# Patient Record
Sex: Male | Born: 1960 | Race: White | Hispanic: No | Marital: Married | Smoking: Never smoker
Health system: Southern US, Community
[De-identification: ages and names within clinical notes are randomized; demographics above are authoritative.]

## PROBLEM LIST (undated history)

## (undated) DIAGNOSIS — E119 Type 2 diabetes mellitus without complications: Secondary | ICD-10-CM

## (undated) DIAGNOSIS — N289 Disorder of kidney and ureter, unspecified: Secondary | ICD-10-CM

## (undated) DIAGNOSIS — C801 Malignant (primary) neoplasm, unspecified: Secondary | ICD-10-CM

## (undated) DIAGNOSIS — E079 Disorder of thyroid, unspecified: Secondary | ICD-10-CM

## (undated) DIAGNOSIS — K219 Gastro-esophageal reflux disease without esophagitis: Secondary | ICD-10-CM

## (undated) HISTORY — PX: THYROIDECTOMY: SHX17

---

## 2014-06-19 ENCOUNTER — Emergency Department (HOSPITAL_BASED_OUTPATIENT_CLINIC_OR_DEPARTMENT_OTHER)
Admission: EM | Admit: 2014-06-19 | Discharge: 2014-06-19 | Disposition: A | Discharge status: Home / Self Care | Payer: BC Managed Care – PPO | Attending: Emergency Medicine | Admitting: Emergency Medicine

## 2014-06-19 ENCOUNTER — Encounter (HOSPITAL_BASED_OUTPATIENT_CLINIC_OR_DEPARTMENT_OTHER): Payer: Self-pay | Admitting: Emergency Medicine

## 2014-06-19 DIAGNOSIS — R0789 Other chest pain: Secondary | ICD-10-CM | POA: Diagnosis present

## 2014-06-19 DIAGNOSIS — E079 Disorder of thyroid, unspecified: Secondary | ICD-10-CM | POA: Insufficient documentation

## 2014-06-19 DIAGNOSIS — Z79899 Other long term (current) drug therapy: Secondary | ICD-10-CM | POA: Insufficient documentation

## 2014-06-19 DIAGNOSIS — Z859 Personal history of malignant neoplasm, unspecified: Secondary | ICD-10-CM | POA: Insufficient documentation

## 2014-06-19 DIAGNOSIS — K219 Gastro-esophageal reflux disease without esophagitis: Secondary | ICD-10-CM | POA: Insufficient documentation

## 2014-06-19 DIAGNOSIS — E119 Type 2 diabetes mellitus without complications: Secondary | ICD-10-CM | POA: Insufficient documentation

## 2014-06-19 HISTORY — DX: Type 2 diabetes mellitus without complications: E11.9

## 2014-06-19 HISTORY — DX: Gastro-esophageal reflux disease without esophagitis: K21.9

## 2014-06-19 HISTORY — DX: Disorder of thyroid, unspecified: E07.9

## 2014-06-19 HISTORY — DX: Malignant (primary) neoplasm, unspecified: C80.1

## 2014-06-19 LAB — COMPREHENSIVE METABOLIC PANEL
ALBUMIN: 4 g/dL (ref 3.5–5.2)
ALT: 24 U/L (ref 0–53)
ANION GAP: 14 (ref 5–15)
AST: 20 U/L (ref 0–37)
Alkaline Phosphatase: 44 U/L (ref 39–117)
BUN: 20 mg/dL (ref 6–23)
CALCIUM: 9.9 mg/dL (ref 8.4–10.5)
CO2: 25 mEq/L (ref 19–32)
Chloride: 101 mEq/L (ref 96–112)
Creatinine, Ser: 1 mg/dL (ref 0.50–1.35)
GFR calc non Af Amer: 85 mL/min — ABNORMAL LOW (ref >90)
Glucose, Bld: 116 mg/dL — ABNORMAL HIGH (ref 70–99)
Potassium: 3.8 mEq/L (ref 3.7–5.3)
Sodium: 140 mEq/L (ref 137–147)
TOTAL PROTEIN: 7.5 g/dL (ref 6.0–8.3)
Total Bilirubin: 0.4 mg/dL (ref 0.3–1.2)

## 2014-06-19 LAB — CBC
HCT: 39.5 % (ref 39.0–52.0)
Hemoglobin: 13.8 g/dL (ref 13.0–17.0)
MCH: 31 pg (ref 26.0–34.0)
MCHC: 34.9 g/dL (ref 30.0–36.0)
MCV: 88.8 fL (ref 78.0–100.0)
Platelets: 225 10*3/uL (ref 150–400)
RBC: 4.45 MIL/uL (ref 4.22–5.81)
RDW: 12.9 % (ref 11.5–15.5)
WBC: 11.4 10*3/uL — AB (ref 4.0–10.5)

## 2014-06-19 LAB — TROPONIN I: Troponin I: <0.3 ng/mL (ref <0.30)

## 2014-06-19 MED ORDER — SUCRALFATE 1 G PO TABS: 1.0000 g | ORAL_TABLET | Freq: Four times a day (QID) | ORAL | Status: AC

## 2014-06-19 MED ORDER — GI COCKTAIL ~~LOC~~
30.0000 mL | Freq: Once | ORAL | Status: AC
  Administered 2014-06-19: 30 mL via ORAL
  Filled 2014-06-19: qty 30

## 2014-06-19 MED ORDER — PANTOPRAZOLE SODIUM 20 MG PO TBEC: 40.0000 mg | DELAYED_RELEASE_TABLET | Freq: Every day | ORAL | Status: AC

## 2014-06-19 NOTE — ED Notes (Signed)
Reports acid reflux x 2 weeks   Getting worse

## 2014-06-19 NOTE — Discharge Instructions (Signed)
Food Choices for Gastroesophageal Reflux Disease When you have gastroesophageal reflux disease (GERD), the foods you eat and your eating habits are very important. Choosing the right foods can help ease the discomfort of GERD. WHAT GENERAL GUIDELINES DO I NEED TO FOLLOW?  Choose fruits, vegetables, whole grains, low-fat dairy products, and low-fat meat, fish, and poultry.  Limit fats such as oils, salad dressings, butter, nuts, and avocado.  Keep a food diary to identify foods that cause symptoms.  Avoid foods that cause reflux. These may be different for different people.  Eat frequent small meals instead of three large meals each day.  Eat your meals slowly, in a relaxed setting.  Limit fried foods.  Cook foods using methods other than frying.  Avoid drinking alcohol.  Avoid drinking large amounts of liquids with your meals.  Avoid bending over or lying down until 2-3 hours after eating. WHAT FOODS ARE NOT RECOMMENDED? The following are some foods and drinks that may worsen your symptoms: Vegetables Tomatoes. Tomato juice. Tomato and spaghetti sauce. Chili peppers. Onion and garlic. Horseradish. Fruits Oranges, grapefruit, and lemon (fruit and juice). Meats High-fat meats, fish, and poultry. This includes hot dogs, ribs, ham, sausage, salami, and bacon. Dairy Whole milk and chocolate milk. Sour cream. Cream. Butter. Ice cream. Cream cheese.  Beverages Coffee and tea, with or without caffeine. Carbonated beverages or energy drinks. Condiments Hot sauce. Barbecue sauce.  Sweets/Desserts Chocolate and cocoa. Donuts. Peppermint and spearmint. Fats and Oils High-fat foods, including [place] fries and potato chips. Other Vinegar. Strong spices, such as black pepper, white pepper, red pepper, cayenne, curry powder, cloves, ginger, and chili powder. The items listed above may not be a complete list of foods and beverages to avoid. Contact your dietitian for more  information. Document Released: 10/09/2005 Document Revised: 10/14/2013 Document Reviewed: 08/13/2013 [facility] Patient Information 2015 [place], [place]. This information is not intended to replace advice given to you by your health care provider. Make sure you discuss any questions you have with your health care provider.  Gastroesophageal Reflux Disease, Adult Gastroesophageal reflux disease (GERD) happens when acid from your stomach flows up into the esophagus. When acid comes in contact with the esophagus, the acid causes soreness (inflammation) in the esophagus. Over time, GERD may create small holes (ulcers) in the lining of the esophagus. CAUSES   Increased body weight. This puts pressure on the stomach, making acid rise from the stomach into the esophagus.  Smoking. This increases acid production in the stomach.  Drinking alcohol. This causes decreased pressure in the lower esophageal sphincter (valve or ring of muscle between the esophagus and stomach), allowing acid from the stomach into the esophagus.  Late evening meals and a full stomach. This increases pressure and acid production in the stomach.  A malformed lower esophageal sphincter. Sometimes, no cause is found. SYMPTOMS   Burning pain in the lower part of the mid-chest behind the breastbone and in the mid-stomach area. This may occur twice a week or more often.  Trouble swallowing.  Sore throat.  Dry cough.  Asthma-like symptoms including chest tightness, shortness of breath, or wheezing. DIAGNOSIS  Your caregiver may be able to diagnose GERD based on your symptoms. In some cases, X-rays and other tests may be done to check for complications or to check the condition of your stomach and esophagus. TREATMENT  Your caregiver may recommend over-the-counter or prescription medicines to help decrease acid production. Ask your caregiver before starting or adding any new medicines.  HOME  CARE INSTRUCTIONS   Change the  factors that you can control. Ask your caregiver for guidance concerning weight loss, quitting smoking, and alcohol consumption.  Avoid foods and drinks that make your symptoms worse, such as:  Caffeine or alcoholic drinks.  Chocolate.  Peppermint or mint flavorings.  Garlic and onions.  Spicy foods.  Citrus fruits, such as oranges, lemons, or limes.  Tomato-based foods such as sauce, chili, salsa, and pizza.  Fried and fatty foods.  Avoid lying down for the 3 hours prior to your bedtime or prior to taking a nap.  Eat small, frequent meals instead of large meals.  Wear loose-fitting clothing. Do not wear anything tight around your waist that causes pressure on your stomach.  Raise the head of your bed 6 to 8 inches with wood blocks to help you sleep. Extra pillows will not help.  Only take over-the-counter or prescription medicines for pain, discomfort, or fever as directed by your caregiver.  Do not take aspirin, ibuprofen, or other nonsteroidal anti-inflammatory drugs (NSAIDs). SEEK IMMEDIATE MEDICAL CARE IF:   You have pain in your arms, neck, jaw, teeth, or back.  Your pain increases or changes in intensity or duration.  You develop nausea, vomiting, or sweating (diaphoresis).  You develop shortness of breath, or you faint.  Your vomit is green, yellow, black, or looks like coffee grounds or blood.  Your stool is red, bloody, or black. These symptoms could be signs of other problems, such as heart disease, gastric bleeding, or esophageal bleeding. MAKE SURE YOU:   Understand these instructions.  Will watch your condition.  Will get help right away if you are not doing well or get worse. Document Released: 07/19/2005 Document Revised: 01/01/2012 Document Reviewed: 04/28/2011 [facility] Patient Information 2015 [place], [place]. This information is not intended to replace advice given to you by your health care provider. Make sure you discuss any questions you have  with your health care provider.  Heartburn Heartburn is a painful, burning sensation in the chest. It may feel worse in certain positions, such as lying down or bending over. It is caused by stomach acid backing up into the tube that carries food from the mouth down to the stomach (lower esophagus).  CAUSES   Large meals.  Certain foods and drinks.  Exercise.  Increased acid production.  Being overweight or obese.  Certain medicines. SYMPTOMS   Burning pain in the chest or lower throat.  Bitter taste in the mouth.  Coughing. DIAGNOSIS  If the usual treatments for heartburn do not improve your symptoms, then tests may be done to see if there is another condition present. Possible tests may include:  X-rays.  Endoscopy. This is when a tube with a light and a camera on the end is used to examine the esophagus and the stomach.  A test to measure the amount of acid in the esophagus (pH test).  A test to see if the esophagus is working properly (esophageal manometry).  Blood, breath, or stool tests to check for bacteria that cause ulcers. TREATMENT   Your caregiver may tell you to use certain over-the-counter medicines (antacids, acid reducers) for mild heartburn.  Your caregiver may prescribe medicines to decrease the acid in your stomach or protect your stomach lining.  Your caregiver may recommend certain diet changes.  For severe cases, your caregiver may recommend that the head of your bed be elevated on blocks. (Sleeping with more pillows is not an effective treatment as it only changes the  position of your head and does not improve the main problem of stomach acid refluxing into the esophagus.) HOME CARE INSTRUCTIONS   Take all medicines as directed by your caregiver.  Raise the head of your bed by putting blocks under the legs if instructed to by your caregiver.  Do not exercise right after eating.  Avoid eating 2 or 3 hours before bed. Do not lie down right  after eating.  Eat small meals throughout the day instead of 3 large meals.  Stop smoking if you smoke.  Maintain a healthy weight.  Identify foods and beverages that make your symptoms worse and avoid them. Foods you may want to avoid include:  Peppers.  Chocolate.  High-fat foods, including fried foods.  Spicy foods.  Garlic and onions.  Citrus fruits, including oranges, grapefruit, lemons, and limes.  Food containing tomatoes or tomato products.  Mint.  Carbonated drinks, caffeinated drinks, and alcohol.  Vinegar. SEEK IMMEDIATE MEDICAL CARE IF:  You have severe chest pain that goes down your arm or into your jaw or neck.  You feel sweaty, dizzy, or lightheaded.  You are short of breath.  You vomit blood.  You have difficulty or pain with swallowing.  You have bloody or black, tarry stools.  You have episodes of heartburn more than 3 times a week for more than 2 weeks. MAKE SURE YOU:  Understand these instructions.  Will watch your condition.  Will get help right away if you are not doing well or get worse. Document Released: 02/25/2009 Document Revised: 01/01/2012 Document Reviewed: 03/26/2011 [facility] Patient Information 2015 [place], [place]. This information is not intended to replace advice given to you by your health care provider. Make sure you discuss any questions you have with your health care provider.

## 2014-06-19 NOTE — ED Provider Notes (Signed)
CSN: [id number]     Arrival date & time 06/19/14  0033 History   First MD Initiated Contact with Patient 06/19/14 0131     Chief Complaint  Patient presents with  . Gastrophageal Reflux     (Consider location/radiation/quality/duration/timing/severity/associated sxs/prior Treatment) HPI 53 year old male presents to emergency room from home with complaint of reflux.  Patient reports over the last 2 weeks he has had a burning pressure in the middle of his chest.  Symptoms have been worsening over the last several days.  He saw his primary care Dr. to beginning the week, and was put on metformin secondary to elevated blood sugar.  Patient also reports taking Crestor and Synthroid.  Patient reports he is an active individual, swims several times a week.  Patient reports symptoms immediately after eating, worse with eating [place] yogurt.  He has been taking Pepcid without improvement in symptoms.  Patient has been prescribed another medication but his primary care Dr., but has not yet started it.  No family history of coronary disease, patient is nonsmoker.  He denies any diaphoresis shortness of breath or nausea associated with the chest pressure. Past Medical History  Diagnosis Date  . Thyroid disease   . Cancer   . Diabetes mellitus without complication   . GERD (gastroesophageal reflux disease)    Past Surgical History  Procedure Laterality Date  . Thyroidectomy     No family history on file. History  Substance Use Topics  . Smoking status: Never Smoker   . Smokeless tobacco: Not on file  . Alcohol Use: No    Review of Systems   See History of Present Illness; otherwise all other systems are reviewed and negative  Allergies  Review of patient's allergies indicates no known allergies.  Home Medications   Prior to Admission medications   Medication Sig Start Date End Date Taking? Authorizing Provider  levothyroxine (SYNTHROID, LEVOTHROID) 125 MCG tablet Take 125 mcg by mouth  daily before breakfast.   Yes Historical Provider, MD  rosuvastatin (CRESTOR) 10 MG tablet Take 10 mg by mouth daily.   Yes Historical Provider, MD   BP 150/96  Pulse 61  Temp(Src) 98.3 F (36.8 C) (Oral)  Resp 18  Ht 6\' 4"  (1.93 m)  Wt 240 lb (108.863 kg)  BMI 29.23 kg/m2  SpO2 98% Physical Exam  Nursing note and vitals reviewed. Constitutional: He is oriented to person, place, and time. He appears well-developed and well-nourished.  HENT:  Head: Normocephalic and atraumatic.  Nose: Nose normal.  Mouth/Throat: Oropharynx is clear and moist.  Eyes: Conjunctivae and EOM are normal. Pupils are equal, round, and reactive to light.  Neck: Normal range of motion. Neck supple. No JVD present. No tracheal deviation present. No thyromegaly present.  Cardiovascular: Normal rate, regular rhythm, normal heart sounds and intact distal pulses.  Exam reveals no gallop and no friction rub.   No murmur heard. Pulmonary/Chest: Effort normal and breath sounds normal. No stridor. No respiratory distress. He has no wheezes. He has no rales. He exhibits no tenderness.  Abdominal: Soft. Bowel sounds are normal. He exhibits no distension and no mass. There is no tenderness. There is no rebound and no guarding.  Musculoskeletal: Normal range of motion. He exhibits no edema and no tenderness.  Lymphadenopathy:    He has no cervical adenopathy.  Neurological: He is alert and oriented to person, place, and time. He exhibits normal muscle tone. Coordination normal.  Skin: Skin is warm and dry. No rash noted. No  erythema. No pallor.  Psychiatric: He has a normal mood and affect. His behavior is normal. Judgment and thought content normal.    ED Course  Procedures (including critical care time) Labs Review Labs Reviewed  CBC - Abnormal; Notable for the following:    WBC 11.4 (*)    All other components within normal limits  COMPREHENSIVE METABOLIC PANEL - Abnormal; Notable for the following:    Glucose,  Bld 116 (*)    GFR calc non Af Amer 85 (*)    All other components within normal limits  TROPONIN I    Imaging Review No results found.   EKG Interpretation   Date/Time:  Friday June 19 2014 01:52:36 EDT Ventricular Rate:  60 PR Interval:  170 QRS Duration: 86 QT Interval:  400 QTC Calculation: 400 R Axis:   18 Text Interpretation:  Normal sinus rhythm Normal ECG No old tracing to  compare Confirmed by Irfan Veal  MD, Keniesha Adderly ([id number]) on 06/19/2014 1:55:42 AM      MDM   Final diagnoses:  Gastroesophageal reflux disease, esophagitis presence not specified    53 year old male with burning in his chest, reflux type symptoms but also with chest pressure after eating.  Patient has history of high cholesterol and elevated blood sugar.  Will get EKG and baseline labs.  We'll get a GI cocktail and continue to monitor.  2:58 AM Labs unremarkable.  Pt feeling resolved with gi cocktail.  Has good f/u with pcm.    Kalman Drape, MD 06/19/14 [phone]

## 2020-08-10 ENCOUNTER — Other Ambulatory Visit: Payer: Self-pay

## 2020-08-10 ENCOUNTER — Emergency Department (HOSPITAL_BASED_OUTPATIENT_CLINIC_OR_DEPARTMENT_OTHER)
Admission: EM | Admit: 2020-08-10 | Discharge: 2020-08-10 | Disposition: A | Discharge status: Home / Self Care | Payer: BC Managed Care – PPO | Attending: Emergency Medicine | Admitting: Emergency Medicine

## 2020-08-10 ENCOUNTER — Encounter (HOSPITAL_BASED_OUTPATIENT_CLINIC_OR_DEPARTMENT_OTHER): Payer: Self-pay | Admitting: Emergency Medicine

## 2020-08-10 ENCOUNTER — Emergency Department (HOSPITAL_BASED_OUTPATIENT_CLINIC_OR_DEPARTMENT_OTHER): Payer: BC Managed Care – PPO

## 2020-08-10 DIAGNOSIS — Z79899 Other long term (current) drug therapy: Secondary | ICD-10-CM | POA: Diagnosis not present

## 2020-08-10 DIAGNOSIS — N200 Calculus of kidney: Secondary | ICD-10-CM

## 2020-08-10 DIAGNOSIS — K219 Gastro-esophageal reflux disease without esophagitis: Secondary | ICD-10-CM | POA: Insufficient documentation

## 2020-08-10 DIAGNOSIS — Z859 Personal history of malignant neoplasm, unspecified: Secondary | ICD-10-CM | POA: Diagnosis not present

## 2020-08-10 DIAGNOSIS — R109 Unspecified abdominal pain: Secondary | ICD-10-CM | POA: Diagnosis present

## 2020-08-10 DIAGNOSIS — E119 Type 2 diabetes mellitus without complications: Secondary | ICD-10-CM | POA: Insufficient documentation

## 2020-08-10 HISTORY — DX: Disorder of kidney and ureter, unspecified: N28.9

## 2020-08-10 LAB — CBC WITH DIFFERENTIAL/PLATELET
Abs Immature Granulocytes: 0.02 10*3/uL (ref 0.00–0.07)
Basophils Absolute: 0 10*3/uL (ref 0.0–0.1)
Basophils Relative: 0 %
Eosinophils Absolute: 0.2 10*3/uL (ref 0.0–0.5)
Eosinophils Relative: 2 %
HCT: 40 % (ref 39.0–52.0)
Hemoglobin: 13.7 g/dL (ref 13.0–17.0)
Immature Granulocytes: 0 %
Lymphocytes Relative: 26 %
Lymphs Abs: 2.2 10*3/uL (ref 0.7–4.0)
MCH: 30.8 pg (ref 26.0–34.0)
MCHC: 34.3 g/dL (ref 30.0–36.0)
MCV: 89.9 fL (ref 80.0–100.0)
Monocytes Absolute: 0.7 10*3/uL (ref 0.1–1.0)
Monocytes Relative: 8 %
Neutro Abs: 5.4 10*3/uL (ref 1.7–7.7)
Neutrophils Relative %: 64 %
Platelets: 235 10*3/uL (ref 150–400)
RBC: 4.45 MIL/uL (ref 4.22–5.81)
RDW: 12.7 % (ref 11.5–15.5)
WBC: 8.6 10*3/uL (ref 4.0–10.5)
nRBC: 0 % (ref 0.0–0.2)

## 2020-08-10 LAB — BASIC METABOLIC PANEL
Anion gap: 12 (ref 5–15)
BUN: 17 mg/dL (ref 6–20)
CO2: 25 mmol/L (ref 22–32)
Calcium: 9.2 mg/dL (ref 8.9–10.3)
Chloride: 100 mmol/L (ref 98–111)
Creatinine, Ser: 1.31 mg/dL — ABNORMAL HIGH (ref 0.61–1.24)
GFR, Estimated: >60 mL/min (ref >60)
Glucose, Bld: 203 mg/dL — ABNORMAL HIGH (ref 70–99)
Potassium: 3.8 mmol/L (ref 3.5–5.1)
Sodium: 137 mmol/L (ref 135–145)

## 2020-08-10 LAB — URINALYSIS, ROUTINE W REFLEX MICROSCOPIC
Bilirubin Urine: NEGATIVE
Glucose, UA: 100 mg/dL — AB
Ketones, ur: NEGATIVE mg/dL
Leukocytes,Ua: NEGATIVE
Nitrite: NEGATIVE
Protein, ur: NEGATIVE mg/dL
Specific Gravity, Urine: 1.02 (ref 1.005–1.030)
pH: 6 (ref 5.0–8.0)

## 2020-08-10 LAB — URINALYSIS, MICROSCOPIC (REFLEX)

## 2020-08-10 MED ORDER — KETOROLAC TROMETHAMINE 30 MG/ML IJ SOLN
30.0000 mg | Freq: Once | INTRAMUSCULAR | Status: AC
  Administered 2020-08-10: 30 mg via INTRAVENOUS
  Filled 2020-08-10: qty 1

## 2020-08-10 MED ORDER — SODIUM CHLORIDE 0.9 % IV BOLUS
500.0000 mL | Freq: Once | INTRAVENOUS | Status: AC
  Administered 2020-08-10: 500 mL via INTRAVENOUS

## 2020-08-10 MED ORDER — TAMSULOSIN HCL 0.4 MG PO CAPS
0.4000 mg | ORAL_CAPSULE | ORAL | Status: AC
  Administered 2020-08-10: 0.4 mg via ORAL
  Filled 2020-08-10: qty 1

## 2020-08-10 MED ORDER — ONDANSETRON 8 MG PO TBDP: ORAL_TABLET | ORAL | 0 refills | Status: AC

## 2020-08-10 MED ORDER — TAMSULOSIN HCL 0.4 MG PO CAPS: 0.4000 mg | ORAL_CAPSULE | Freq: Every day | ORAL | 0 refills | Status: AC

## 2020-08-10 MED ORDER — ONDANSETRON HCL 4 MG/2ML IJ SOLN
4.0000 mg | Freq: Once | INTRAMUSCULAR | Status: AC
  Administered 2020-08-10: 4 mg via INTRAVENOUS
  Filled 2020-08-10: qty 2

## 2020-08-10 MED ORDER — TRAMADOL HCL 50 MG PO TABS: 50.0000 mg | ORAL_TABLET | Freq: Four times a day (QID) | ORAL | 0 refills | Status: DC | PRN

## 2020-08-10 MED ORDER — FENTANYL CITRATE (PF) 100 MCG/2ML IJ SOLN
50.0000 ug | Freq: Once | INTRAMUSCULAR | Status: AC
  Administered 2020-08-10: 50 ug via INTRAVENOUS
  Filled 2020-08-10: qty 2

## 2020-08-10 MED ORDER — DICLOFENAC SODIUM ER 100 MG PO TB24: 100.0000 mg | ORAL_TABLET | Freq: Every day | ORAL | 0 refills | Status: AC

## 2020-08-10 NOTE — ED Provider Notes (Signed)
[facility] EMERGENCY DEPARTMENT Provider Note   CSN: [id number] Arrival date & time: 08/10/20  0457     History Chief Complaint  Patient presents with  . Flank Pain    Vernon Smith is a 59 y.o. male.  The history is provided by the patient.  Flank Pain This is a recurrent problem. The current episode started 12 to 24 hours ago. Episode frequency: intermittently  The problem has been gradually worsening. Pertinent negatives include no chest pain, no headaches and no shortness of breath. Nothing aggravates the symptoms. Nothing relieves the symptoms. Treatments tried: narcotics left over from previous  The treatment provided no relief.       Past Medical History:  Diagnosis Date  . Cancer ([place])   . Diabetes mellitus without complication ([place])   . GERD (gastroesophageal reflux disease)   . Renal disorder   . Thyroid disease     There are no problems to display for this patient.   Past Surgical History:  Procedure Laterality Date  . THYROIDECTOMY         No family history on file.  Social History   Tobacco Use  . Smoking status: Never Smoker  Substance Use Topics  . Alcohol use: No  . Drug use: No    Home Medications Prior to Admission medications   Medication Sig Start Date End Date Taking? Authorizing Provider  levothyroxine (SYNTHROID, LEVOTHROID) 125 MCG tablet Take 125 mcg by mouth daily before breakfast.    [provider]  pantoprazole (PROTONIX) 20 MG tablet Take 2 tablets (40 mg total) by mouth daily. 06/19/14   Linton Flemings, MD  rosuvastatin (CRESTOR) 10 MG tablet Take 10 mg by mouth daily.    [provider]  sucralfate (CARAFATE) 1 G tablet Take 1 tablet (1 g total) by mouth 4 (four) times daily. 06/19/14   Linton Flemings, MD    Allergies    Patient has no known allergies.  Review of Systems   Review of Systems  Constitutional: Negative for unexpected weight change.  HENT: Negative for congestion.   Eyes: Negative  for visual disturbance.  Respiratory: Negative for shortness of breath.   Cardiovascular: Negative for chest pain.  Gastrointestinal: Positive for nausea and vomiting.  Genitourinary: Positive for flank pain.  Musculoskeletal: Negative for arthralgias.  Skin: Negative for rash.  Neurological: Negative for headaches.  Psychiatric/Behavioral: Negative for agitation.  All other systems reviewed and are negative.   Physical Exam Updated Vital Signs BP (!) 197/114   Pulse (!) 56   Temp 97.9 F (36.6 C) (Oral)   Resp 20   Ht 6\' 4"  (1.93 m)   Wt 108.9 kg   SpO2 100%   BMI 29.22 kg/m   Physical Exam Vitals and nursing note reviewed.  Constitutional:      General: He is not in acute distress.    Appearance: Normal appearance.  HENT:     Head: Normocephalic and atraumatic.     Nose: Nose normal.  Eyes:     Conjunctiva/sclera: Conjunctivae normal.     Pupils: Pupils are equal, round, and reactive to light.  Cardiovascular:     Rate and Rhythm: Normal rate and regular rhythm.     Pulses: Normal pulses.     Heart sounds: Normal heart sounds.  Pulmonary:     Effort: Pulmonary effort is normal.     Breath sounds: Normal breath sounds.  Abdominal:     General: Abdomen is flat. Bowel sounds are normal.  Palpations: Abdomen is soft.     Tenderness: There is no abdominal tenderness. There is no guarding.  Musculoskeletal:        General: Normal range of motion.     Cervical back: Normal range of motion and neck supple.  Skin:    General: Skin is warm and dry.     Capillary Refill: Capillary refill takes less than 2 seconds.  Neurological:     General: No focal deficit present.     Mental Status: He is alert and oriented to person, place, and time.  Psychiatric:        Mood and Affect: Mood normal.        Behavior: Behavior normal.     ED Results / Procedures / Treatments   Labs (all labs ordered are listed, but only abnormal results are displayed) Labs Reviewed  CBC  WITH DIFFERENTIAL/PLATELET  URINALYSIS, ROUTINE W REFLEX MICROSCOPIC  BASIC METABOLIC PANEL    EKG None  Radiology No results found.  Procedures Procedures (including critical care time)  Medications Ordered in ED Medications  sodium chloride 0.9 % bolus 500 mL (has no administration in time range)  fentaNYL (SUBLIMAZE) injection 50 mcg (has no administration in time range)  ketorolac (TORADOL) 30 MG/ML injection 30 mg (30 mg Intravenous Given 08/10/20 0525)  ondansetron (ZOFRAN) injection 4 mg (4 mg Intravenous Given 08/10/20 0525)  tamsulosin (FLOMAX) capsule 0.4 mg (0.4 mg Oral Given 08/10/20 0525)    ED Course  I have reviewed the triage vital signs and the nursing notes.  Pertinent labs & imaging results that were available during my care of the patient were reviewed by me and considered in my medical decision making (see chart for details).    Vernon Smith was evaluated in Emergency Department on 08/10/2020 for the symptoms described in the history of present illness. He was evaluated in the context of the global COVID-19 pandemic, which necessitated consideration that the patient might be at risk for infection with the SARS-CoV-2 virus that causes COVID-19. Institutional protocols and algorithms that pertain to the evaluation of patients at risk for COVID-19 are in a state of rapid change based on information released by regulatory bodies including the CDC and federal and state organizations. These policies and algorithms were followed during the patient's care in the ED. Final Clinical Impression(s) / ED Diagnoses Return for intractable cough, coughing up blood,fevers >100.4 unrelieved by medication, shortness of breath, intractable vomiting, chest pain, shortness of breath, weakness,numbness, changes in speech, facial asymmetry,abdominal pain, passing out,Inability to tolerate liquids or food, cough, altered mental status or any concerns. No signs of systemic illness  or infection. The patient is nontoxic-appearing on exam and vital signs are within normal limits.   I have reviewed the triage vital signs and the nursing notes. Pertinent labs &imaging results that were available during my care of the patient were reviewed by me and considered in my medical decision making (see chart for details).After history, exam, and medical workup I feel the patient has beenappropriately medically screened and is safe for discharge home. Pertinent diagnoses were discussed with the patient. Patient was given return precautions.    Emanuella Nickle, MD 08/10/20 2202

## 2020-08-10 NOTE — ED Triage Notes (Signed)
Ptc/o right flank pain with vomiting. Pt reports consistent with previous kidney stone.

## 2020-08-16 ENCOUNTER — Emergency Department (HOSPITAL_BASED_OUTPATIENT_CLINIC_OR_DEPARTMENT_OTHER): Payer: BC Managed Care – PPO

## 2020-08-16 ENCOUNTER — Encounter (HOSPITAL_BASED_OUTPATIENT_CLINIC_OR_DEPARTMENT_OTHER): Payer: Self-pay | Admitting: Emergency Medicine

## 2020-08-16 ENCOUNTER — Other Ambulatory Visit: Payer: Self-pay

## 2020-08-16 ENCOUNTER — Emergency Department (HOSPITAL_BASED_OUTPATIENT_CLINIC_OR_DEPARTMENT_OTHER)
Admission: EM | Admit: 2020-08-16 | Discharge: 2020-08-16 | Disposition: A | Discharge status: Home / Self Care | Payer: BC Managed Care – PPO | Attending: Emergency Medicine | Admitting: Emergency Medicine

## 2020-08-16 DIAGNOSIS — K219 Gastro-esophageal reflux disease without esophagitis: Secondary | ICD-10-CM | POA: Diagnosis not present

## 2020-08-16 DIAGNOSIS — E119 Type 2 diabetes mellitus without complications: Secondary | ICD-10-CM | POA: Insufficient documentation

## 2020-08-16 DIAGNOSIS — N23 Unspecified renal colic: Secondary | ICD-10-CM | POA: Insufficient documentation

## 2020-08-16 DIAGNOSIS — R109 Unspecified abdominal pain: Secondary | ICD-10-CM | POA: Diagnosis present

## 2020-08-16 LAB — CBC WITH DIFFERENTIAL/PLATELET
Abs Immature Granulocytes: 0.04 10*3/uL (ref 0.00–0.07)
Basophils Absolute: 0 10*3/uL (ref 0.0–0.1)
Basophils Relative: 0 %
Eosinophils Absolute: 0.3 10*3/uL (ref 0.0–0.5)
Eosinophils Relative: 3 %
HCT: 40.9 % (ref 39.0–52.0)
Hemoglobin: 14.1 g/dL (ref 13.0–17.0)
Immature Granulocytes: 0 %
Lymphocytes Relative: 25 %
Lymphs Abs: 2.4 10*3/uL (ref 0.7–4.0)
MCH: 30.9 pg (ref 26.0–34.0)
MCHC: 34.5 g/dL (ref 30.0–36.0)
MCV: 89.7 fL (ref 80.0–100.0)
Monocytes Absolute: 1 10*3/uL (ref 0.1–1.0)
Monocytes Relative: 11 %
Neutro Abs: 5.6 10*3/uL (ref 1.7–7.7)
Neutrophils Relative %: 61 %
Platelets: 223 10*3/uL (ref 150–400)
RBC: 4.56 MIL/uL (ref 4.22–5.81)
RDW: 12.4 % (ref 11.5–15.5)
WBC: 9.3 10*3/uL (ref 4.0–10.5)
nRBC: 0 % (ref 0.0–0.2)

## 2020-08-16 LAB — BASIC METABOLIC PANEL
Anion gap: 11 (ref 5–15)
BUN: 20 mg/dL (ref 6–20)
CO2: 25 mmol/L (ref 22–32)
Calcium: 8.7 mg/dL — ABNORMAL LOW (ref 8.9–10.3)
Chloride: 99 mmol/L (ref 98–111)
Creatinine, Ser: 1.43 mg/dL — ABNORMAL HIGH (ref 0.61–1.24)
GFR, Estimated: 57 mL/min — ABNORMAL LOW (ref >60)
Glucose, Bld: 163 mg/dL — ABNORMAL HIGH (ref 70–99)
Potassium: 3.7 mmol/L (ref 3.5–5.1)
Sodium: 135 mmol/L (ref 135–145)

## 2020-08-16 LAB — URINALYSIS, ROUTINE W REFLEX MICROSCOPIC
Bilirubin Urine: NEGATIVE
Glucose, UA: 100 mg/dL — AB
Ketones, ur: NEGATIVE mg/dL
Leukocytes,Ua: NEGATIVE
Nitrite: NEGATIVE
Protein, ur: NEGATIVE mg/dL
Specific Gravity, Urine: 1.02 (ref 1.005–1.030)
pH: 6 (ref 5.0–8.0)

## 2020-08-16 LAB — URINALYSIS, MICROSCOPIC (REFLEX): Squamous Epithelial / LPF: NONE SEEN (ref 0–5)

## 2020-08-16 MED ORDER — SODIUM CHLORIDE 0.9 % IV BOLUS
1000.0000 mL | Freq: Once | INTRAVENOUS | Status: AC
  Administered 2020-08-16: 1000 mL via INTRAVENOUS

## 2020-08-16 MED ORDER — MORPHINE SULFATE (PF) 4 MG/ML IV SOLN
4.0000 mg | Freq: Once | INTRAVENOUS | Status: AC
  Administered 2020-08-16: 4 mg via INTRAVENOUS
  Filled 2020-08-16: qty 1

## 2020-08-16 MED ORDER — TRAMADOL HCL 50 MG PO TABS
50.0000 mg | ORAL_TABLET | Freq: Four times a day (QID) | ORAL | 0 refills | Status: AC | PRN
Start: 2020-08-16 — End: ?

## 2020-08-16 MED ORDER — ONDANSETRON HCL 4 MG/2ML IJ SOLN
4.0000 mg | Freq: Once | INTRAMUSCULAR | Status: AC
  Administered 2020-08-16: 4 mg via INTRAVENOUS
  Filled 2020-08-16: qty 2

## 2020-08-16 MED ORDER — KETOROLAC TROMETHAMINE 15 MG/ML IJ SOLN
15.0000 mg | Freq: Once | INTRAMUSCULAR | Status: AC
  Administered 2020-08-16: 15 mg via INTRAVENOUS
  Filled 2020-08-16: qty 1

## 2020-08-16 NOTE — ED Provider Notes (Signed)
[facility] EMERGENC[facility] DEPARTMENT Provider Note   CSN: [id number] Arrival date & time: 08/16/20  0034     History Chief Complaint  Patient presents with  . Flank Pain    known kidney stone    Vernon Smith is a 59 y.o. male.  HPI     This is a 59 year old male with a history of diabetes who presents with ongoing right flank pain.  Patient reports that he was seen and evaluated on October 19.  He was found to have a 4 mm kidney stone at that time.  He was discharged with tramadol, Flomax, and Zofran.  He states he thought he was getting better; however, he had a few difficult days this weekend with increasing pain and nausea and vomiting.  He ran out of his tramadol.  Pain acutely worsened tonight.  He reports that it is right-sided radiates into the right lower abdomen.  Currently his pain is 8 out of 10.  He reports persistent nausea.  He has not noted that he has passed the stone.  Pain is consistent with the pain that he had last week.  Past Medical History:  Diagnosis Date  . Cancer ([place])   . Diabetes mellitus without complication ([place])   . GERD (gastroesophageal reflux disease)   . Renal disorder   . Thyroid disease     There are no problems to display for this patient.   Past Surgical History:  Procedure Laterality Date  . THYROIDECTOMY         No family history on file.  Social History   Tobacco Use  . Smoking status: Never Smoker  . Smokeless tobacco: Never Used  Substance Use Topics  . Alcohol use: No  . Drug use: No    Home Medications Prior to Admission medications   Medication Sig Start Date End Date Taking? Authorizing Provider  Diclofenac Sodium CR 100 MG 24 hr tablet Take 1 tablet (100 mg total) by mouth daily. 08/10/20   Palumbo, April, MD  levothyroxine (SYNTHROID, LEVOTHROID) 125 MCG tablet Take 125 mcg by mouth daily before breakfast.    [provider]  ondansetron (ZOFRAN ODT) 8 MG disintegrating tablet 8mg  ODT q8 hours  prn nausea 08/10/20   Palumbo, April, MD  pantoprazole (PROTONIX) 20 MG tablet Take 2 tablets (40 mg total) by mouth daily. 06/19/14   Linton Flemings, MD  rosuvastatin (CRESTOR) 10 MG tablet Take 10 mg by mouth daily.    [provider]  sucralfate (CARAFATE) 1 G tablet Take 1 tablet (1 g total) by mouth 4 (four) times daily. 06/19/14   Linton Flemings, MD  tamsulosin (FLOMAX) 0.4 MG CAPS capsule Take 1 capsule (0.4 mg total) by mouth daily. 08/10/20   Palumbo, April, MD  traMADol (ULTRAM) 50 MG tablet Take 1 tablet (50 mg total) by mouth every 6 (six) hours as needed. 08/16/20   Lateia Fraser, Barbette Hair, MD    Allergies    Patient has no known allergies.  Review of Systems   Review of Systems  Constitutional: Negative for fever.  Respiratory: Negative for shortness of breath.   Cardiovascular: Negative for chest pain.  Gastrointestinal: Positive for nausea and vomiting. Negative for diarrhea.  Genitourinary: Positive for flank pain.  All other systems reviewed and are negative.   Physical Exam Updated Vital Signs BP (!) 143/88   Pulse 61   Temp 98.5 F (36.9 C) (Oral)   Resp 15   Wt 107.1 kg   SpO2 97%  BMI 28.75 kg/m   Physical Exam Vitals and nursing note reviewed.  Constitutional:      Appearance: He is well-developed. He is not ill-appearing.     Comments: Uncomfortable but non-ill-appearing  HENT:     Head: Normocephalic and atraumatic.     Mouth/Throat:     Mouth: Mucous membranes are moist.  Eyes:     Pupils: Pupils are equal, round, and reactive to light.  Cardiovascular:     Rate and Rhythm: Normal rate and regular rhythm.  Pulmonary:     Effort: Pulmonary effort is normal. No respiratory distress.  Abdominal:     General: Bowel sounds are normal.     Palpations: Abdomen is soft.     Tenderness: There is no abdominal tenderness.  Musculoskeletal:     Cervical back: Neck supple.     Right lower leg: No edema.     Left lower leg: No edema.  Lymphadenopathy:       Cervical: No cervical adenopathy.  Skin:    General: Skin is warm and dry.  Neurological:     Mental Status: He is alert and oriented to person, place, and time.  Psychiatric:        Mood and Affect: Mood normal.     ED Results / Procedures / Treatments   Labs (all labs ordered are listed, but only abnormal results are displayed) Labs Reviewed  URINALYSIS, ROUTINE W REFLEX MICROSCOPIC - Abnormal; Notable for the following components:      Result Value   Glucose, UA 100 (*)    Hgb urine dipstick SMALL (*)    All other components within normal limits  BASIC METABOLIC PANEL - Abnormal; Notable for the following components:   Glucose, Bld 163 (*)    Creatinine, Ser 1.43 (*)    Calcium 8.7 (*)    GFR, Estimated 57 (*)    All other components within normal limits  URINALYSIS, MICROSCOPIC (REFLEX) - Abnormal; Notable for the following components:   Bacteria, UA RARE (*)    All other components within normal limits  CBC WITH DIFFERENTIAL/PLATELET    EKG None  Radiology DG Abdomen 1 View  Result Date: 08/16/2020 CLINICAL DATA:  Right flank pain EXAM: ABDOMEN - 1 VIEW COMPARISON:  CT abdomen pelvis 08/10/2020 FINDINGS: The bowel gas pattern is normal. 3 mm calcification at the right renal pelvis is unchanged. The right ureteral stone is not visualized. IMPRESSION: Right ureteral stone is not visualized. Electronically Signed   By: Ulyses Jarred M.D.   On: 08/16/2020 01:26    Procedures Procedures (including critical care time)  Medications Ordered in ED Medications  sodium chloride 0.9 % bolus 1,000 mL (0 mLs Intravenous Stopped 08/16/20 0222)  ondansetron (ZOFRAN) injection 4 mg (4 mg Intravenous Given 08/16/20 0107)  morphine 4 MG/ML injection 4 mg (4 mg Intravenous Given 08/16/20 0107)  ketorolac (TORADOL) 15 MG/ML injection 15 mg (15 mg Intravenous Given 08/16/20 0138)    ED Course  I have reviewed the triage vital signs and the nursing notes.  Pertinent labs &  imaging results that were available during my care of the patient were reviewed by me and considered in my medical decision making (see chart for details).  Clinical Course as of Aug 16 234  Mon Aug 16, 2020  0215 Patient pain much improved.  He rates his pain at 0 out of 10.  We discussed urology follow-up and ongoing symptom management.   [CH]    Clinical Course User Index [  CH] Linsy Ehresman, Barbette Hair, MD   MDM Rules/Calculators/A&P                          Patient presents with ongoing pain consistent with prior kidney stone.  He is overall nontoxic and vital signs are reassuring.  He is afebrile.  No reproducible pain on exam.  Highly suspect recurrent renal colic.  Lower suspicion for appendicitis or other intra-abdominal pathology given nontender exam.  Will treat supportively.  Repeat lab work and a KUB was obtained.  Creatinine slightly increased to 1.47.  Patient was given a liter of fluids.  Otherwise lab work is unremarkable and without significant metabolic derangement or leukocytosis.  Urinalysis without evidence of UTI.  KUB does not visualize stone.  On recheck, patient reports that his pain is completely resolved.  We discussed ongoing pain management and symptom control at home.  Will write a prescription for tramadol.  Given recurrence of pain, recommend that he call urology office tomorrow for follow-up appointment.  After history, exam, and medical workup I feel the patient has been appropriately medically screened and is safe for discharge home. Pertinent diagnoses were discussed with the patient. Patient was given return precautions.  Final Clinical Impression(s) / ED Diagnoses Final diagnoses:  Renal colic    Rx / DC Orders ED Discharge Orders         Ordered    traMADol (ULTRAM) 50 MG tablet  Every 6 hours PRN        08/16/20 0232           Merryl Hacker, MD 08/16/20 [phone]

## 2020-08-16 NOTE — ED Notes (Signed)
Pt to CT

## 2020-08-16 NOTE — ED Triage Notes (Signed)
Reports known kidney stone on right side. Reports hasn't passed stone yet, pain progressively worse. States nausea improved.

## 2020-08-16 NOTE — Discharge Instructions (Addendum)
You were seen today for ongoing kidney stone pain.  Your kidney function is slightly worsened.  Make sure that you are staying hydrated.  Your tramadol prescription will be renewed.  Given ongoing pain, follow-up with urology more closely and call office this morning for follow-up appointment.

## 2021-01-13 IMAGING — CT CT RENAL STONE PROTOCOL
2 of 4 series · 16 of 46 positions shown, 18 images · non-contrast
Comparison: None.

CLINICAL DATA: Flank pain suggesting kidney stone. Laterality is
not indicated.

EXAM:
CT ABDOMEN AND PELVIS WITHOUT CONTRAST
TECHNIQUE: Multidetector CT imaging of the abdomen and pelvis was performed
following the standard protocol without IV contrast.

[Series 2: axial st · axial · 0.83mm/px · z∈[+728,+1198]mm · 13 of 104 slices shown, 15 images]
[im 5/104  soft-tissue]
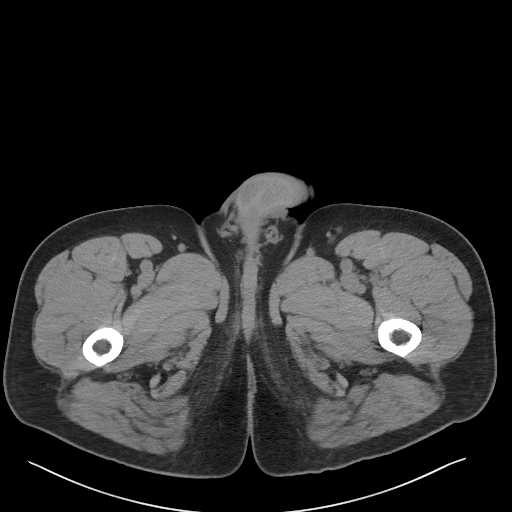
[im 5/104  bone]
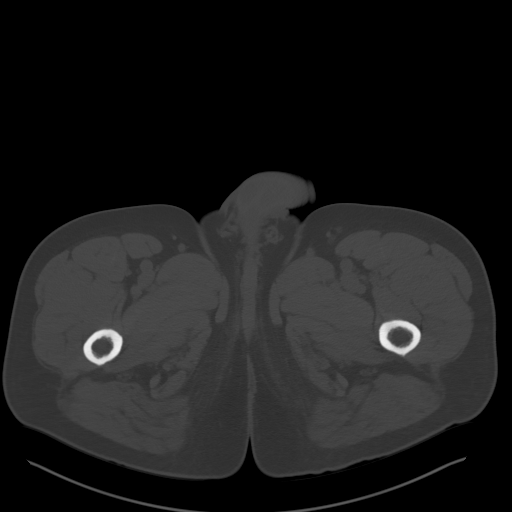
[im 14/104  soft-tissue]
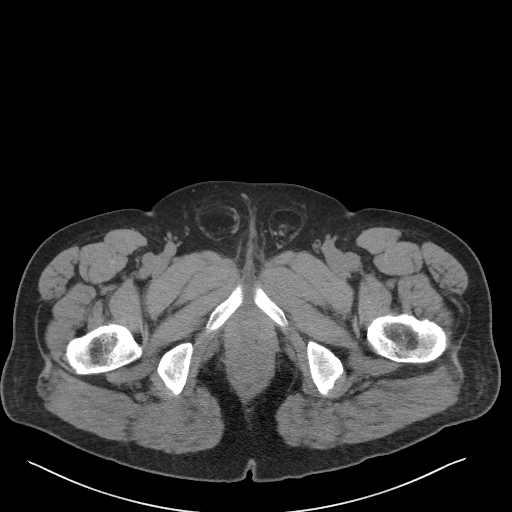
[im 23/104  soft-tissue]
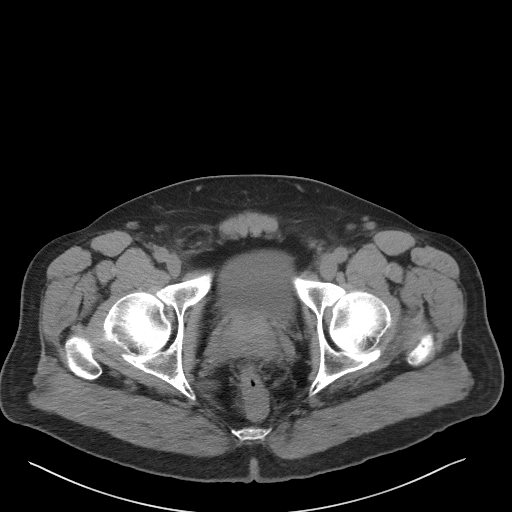
[im 27/104  soft-tissue]
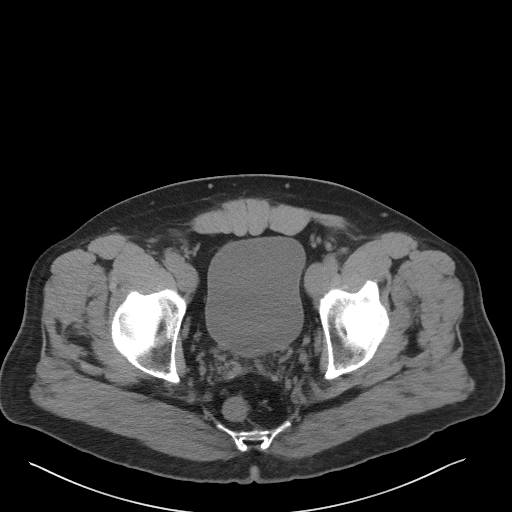
[im 36/104  soft-tissue]
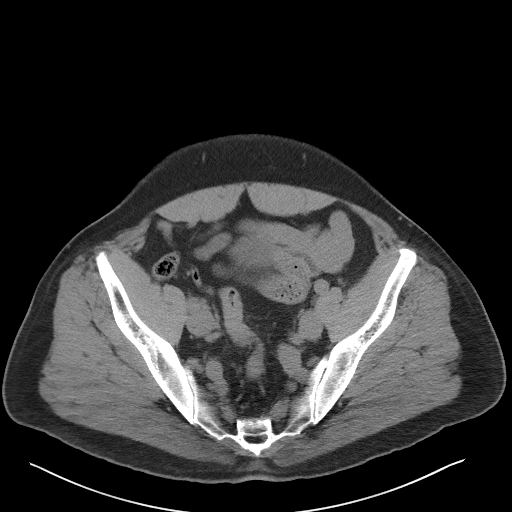
[im 45/104  soft-tissue]
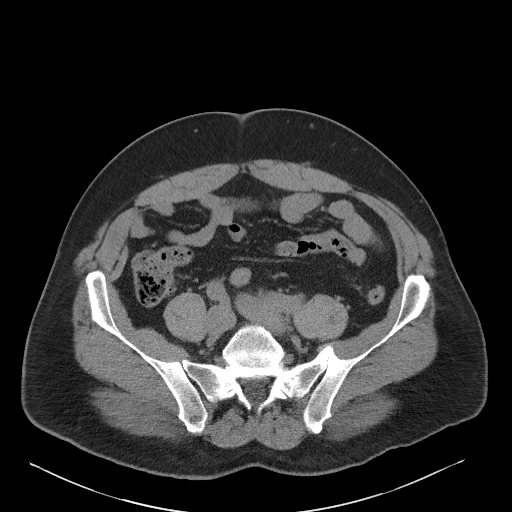
[im 54/104  soft-tissue]
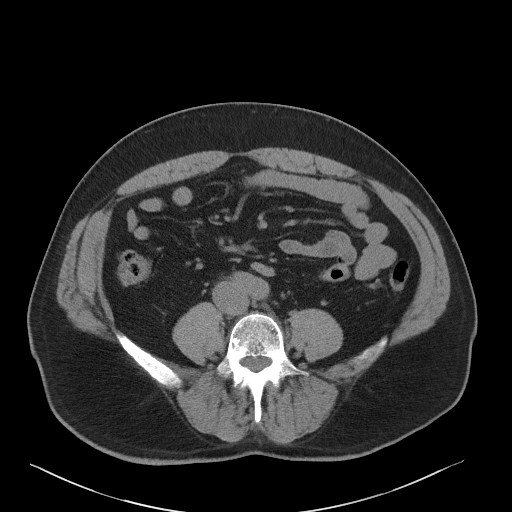
[im 59/104  soft-tissue]
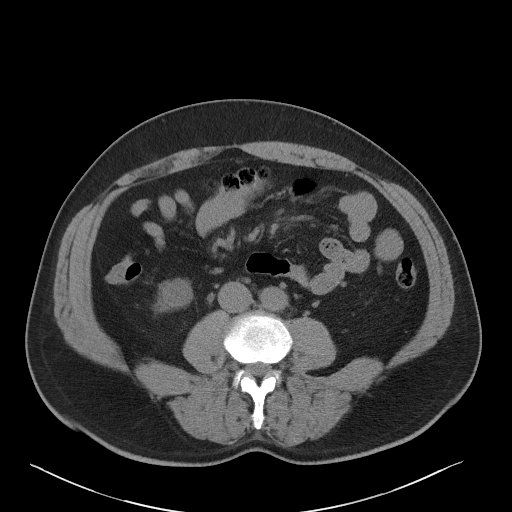
[im 68/104  soft-tissue]
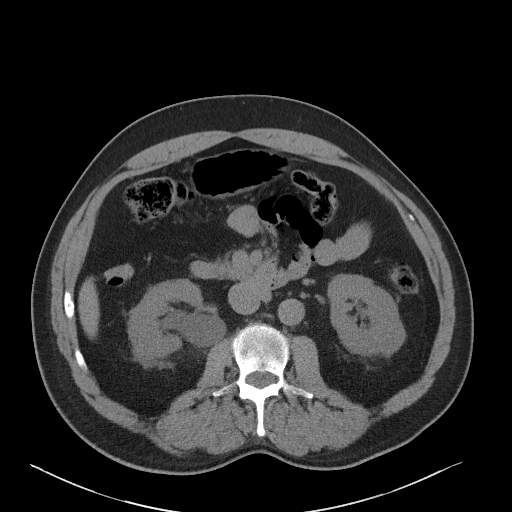
[im 68/104  bone]
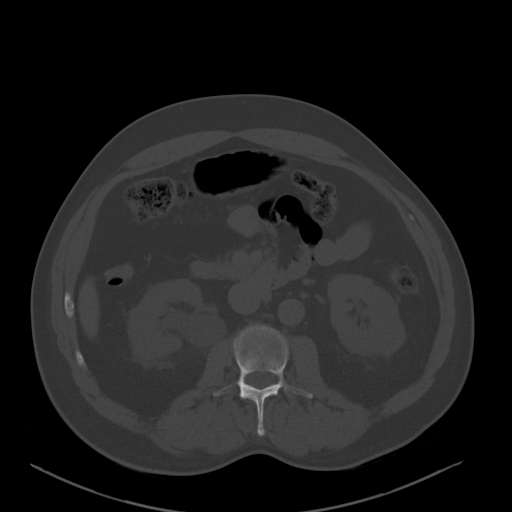
[im 77/104  soft-tissue]
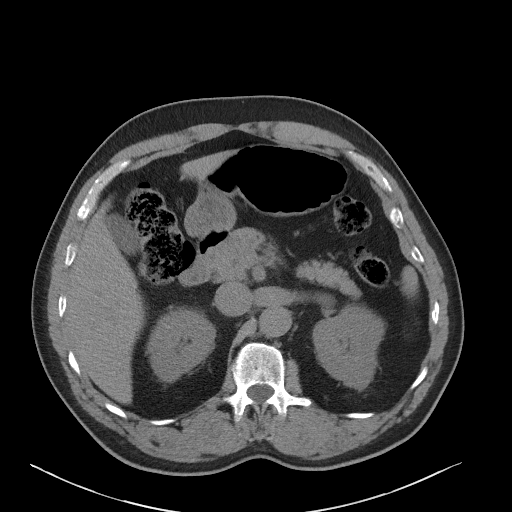
[im 81/104  soft-tissue]
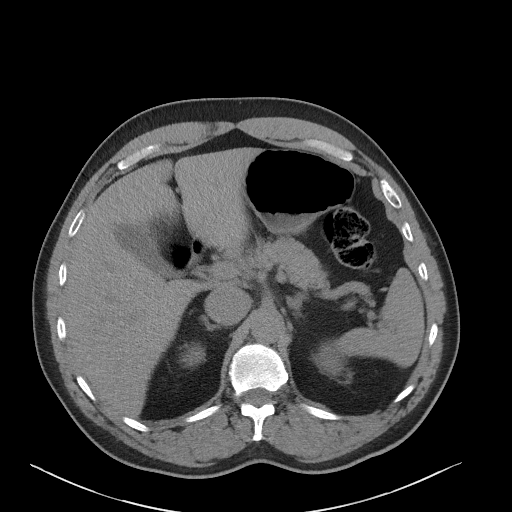
[im 90/104  soft-tissue]
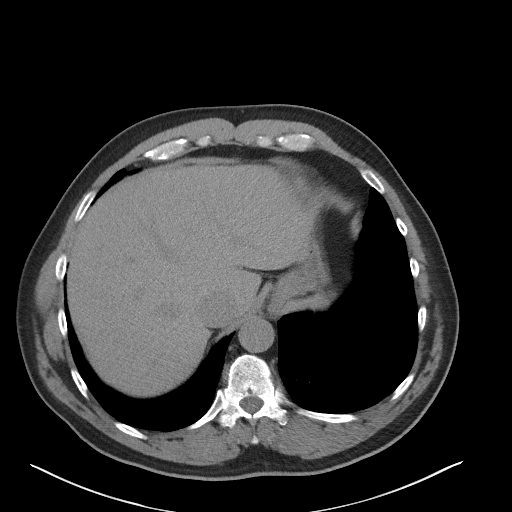
[im 99/104  soft-tissue]
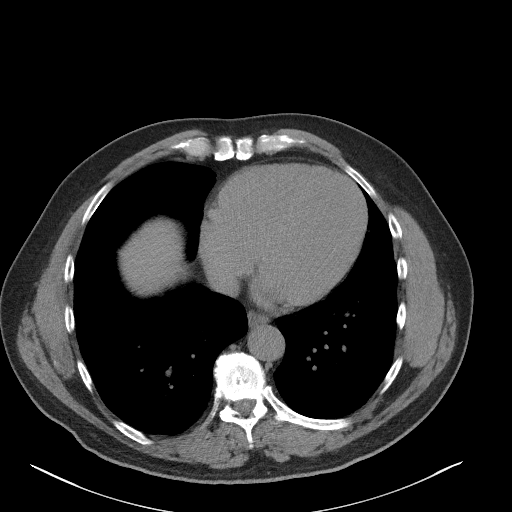

[Series 5: coronal st · coronal · 0.79mm/px · 3 of 101 slices shown]
[im 34/101  soft-tissue]
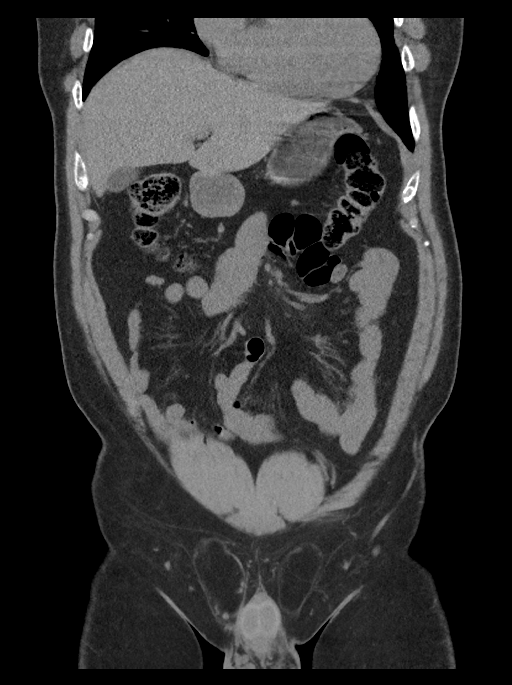
[im 45/101  soft-tissue]
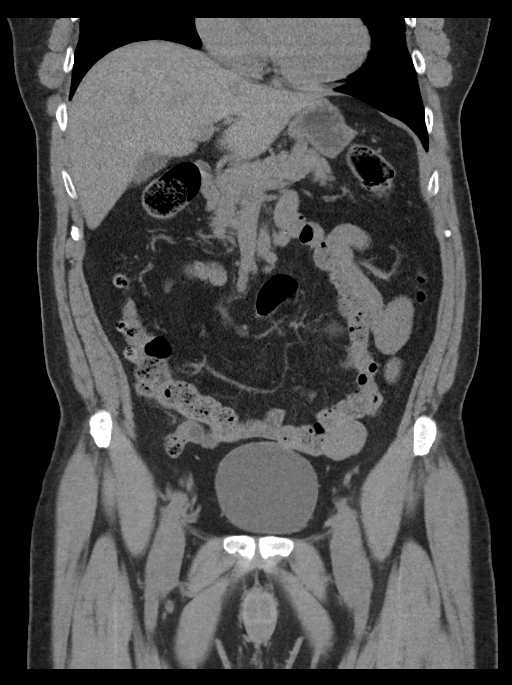
[im 56/101  soft-tissue]
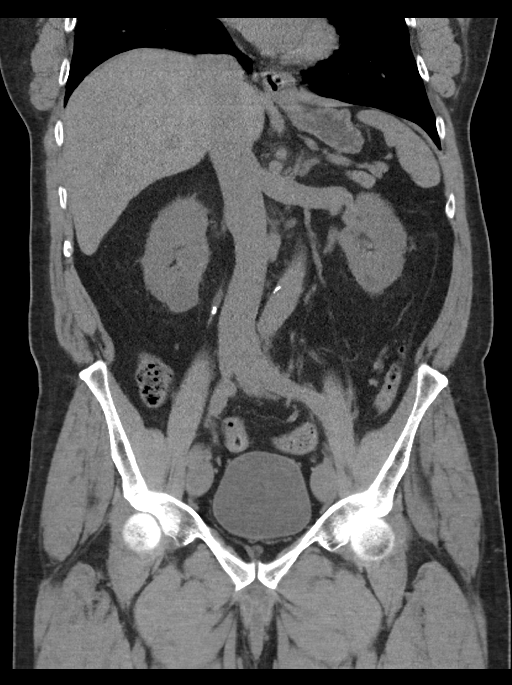

[16 of 46 positions shown; findings below may reference images not displayed]

FINDINGS: Lower chest: Lung bases are clear.

Hepatobiliary: No focal liver abnormality is seen. No gallstones,
gallbladder wall thickening, or biliary dilatation.

Pancreas: Unremarkable. No pancreatic ductal dilatation or
surrounding inflammatory changes.

Spleen: Normal in size without focal abnormality.

Adrenals/Urinary Tract: No adrenal gland nodules. 4 mm stone in the
mid right ureter at the level of L3-4. There is proximal
hydronephrosis and hydroureter with stranding around the right
kidney and ureter. Distal right ureter is decompressed. Several
additional intrarenal stones are demonstrated in both kidneys
without additional obstruction. The bladder is normal.

Stomach/Bowel: Stomach, small bowel, and colon are not abnormally
distended. Scattered stool in the colon. Scattered colonic
diverticula without evidence of diverticulitis. Appendix is normal.

Vascular/Lymphatic: Aortic atherosclerosis. No enlarged abdominal or
pelvic lymph nodes.

Reproductive: Prostate is unremarkable.

Other: Small bilateral inguinal hernias containing fat. No free air
or free fluid in the abdomen.

Musculoskeletal: No acute or significant osseous findings.
IMPRESSION: 1. 4 mm stone in the mid right ureter with moderate proximal
obstruction.
2. Multiple nonobstructing additional intrarenal stones in both
kidneys.
3. Small bilateral inguinal hernias containing fat.
4. Aortic atherosclerosis.

Aortic Atherosclerosis ([id number]).

## 2021-01-19 IMAGING — DX DG ABDOMEN 1V
1 series · 1 of 1 positions shown · non-contrast
Comparison: CT abdomen pelvis 08/10/2020

CLINICAL DATA: Right flank pain

EXAM:
ABDOMEN - 1 VIEW

[abdomen kub]
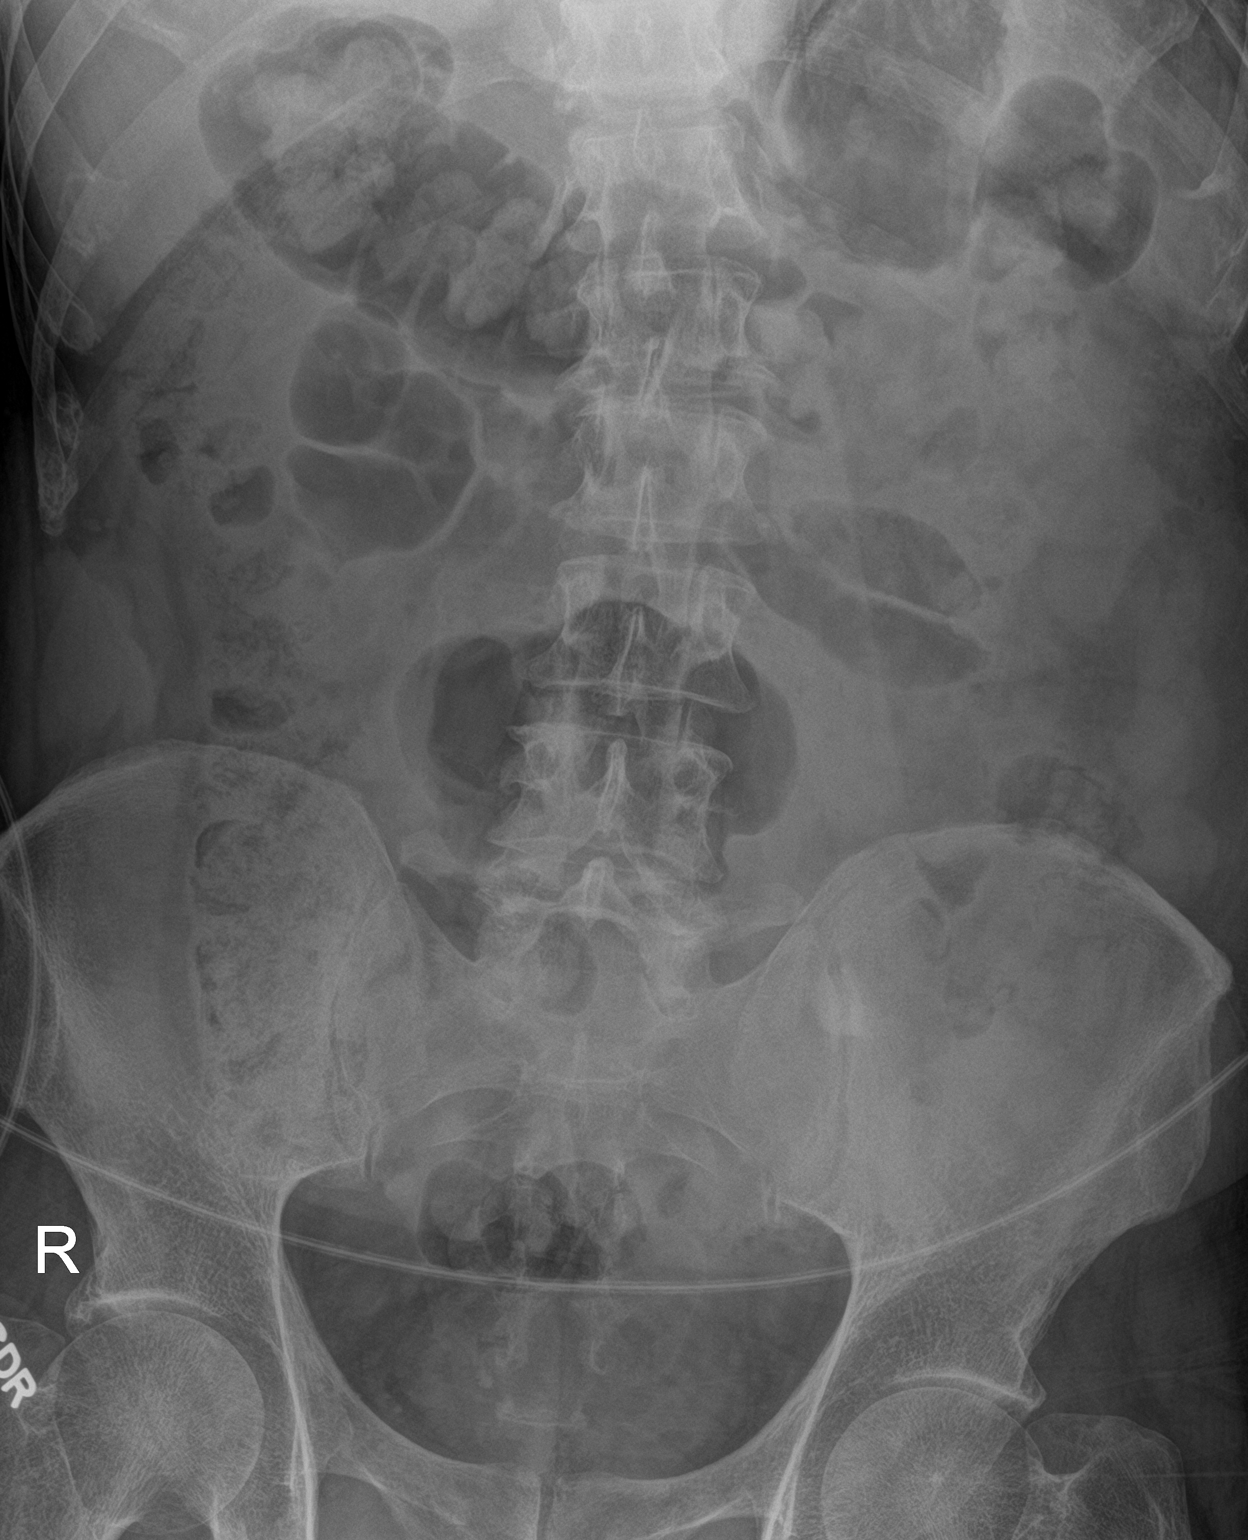

[1 of 1 positions shown; findings below may reference images not displayed]

FINDINGS: The bowel gas pattern is normal. 3 mm calcification at the right
renal pelvis is unchanged. The right ureteral stone is not
visualized.
IMPRESSION: Right ureteral stone is not visualized.

## 2021-07-30 ENCOUNTER — Other Ambulatory Visit: Payer: Self-pay

## 2021-07-30 ENCOUNTER — Encounter (HOSPITAL_BASED_OUTPATIENT_CLINIC_OR_DEPARTMENT_OTHER): Payer: Self-pay | Admitting: *Deleted

## 2021-07-30 ENCOUNTER — Emergency Department (HOSPITAL_BASED_OUTPATIENT_CLINIC_OR_DEPARTMENT_OTHER)
Admission: EM | Admit: 2021-07-30 | Discharge: 2021-07-31 | Disposition: A | Discharge status: Home / Self Care | Payer: BC Managed Care – PPO | Attending: Emergency Medicine | Admitting: Emergency Medicine

## 2021-07-30 DIAGNOSIS — T8062XA Other serum reaction due to vaccination, initial encounter: Secondary | ICD-10-CM | POA: Diagnosis not present

## 2021-07-30 DIAGNOSIS — Z859 Personal history of malignant neoplasm, unspecified: Secondary | ICD-10-CM | POA: Insufficient documentation

## 2021-07-30 DIAGNOSIS — Z4801 Encounter for change or removal of surgical wound dressing: Secondary | ICD-10-CM | POA: Diagnosis not present

## 2021-07-30 DIAGNOSIS — M79602 Pain in left arm: Secondary | ICD-10-CM | POA: Diagnosis not present

## 2021-07-30 DIAGNOSIS — R2231 Localized swelling, mass and lump, right upper limb: Secondary | ICD-10-CM | POA: Insufficient documentation

## 2021-07-30 DIAGNOSIS — Z79899 Other long term (current) drug therapy: Secondary | ICD-10-CM | POA: Insufficient documentation

## 2021-07-30 DIAGNOSIS — T50Z95A Adverse effect of other vaccines and biological substances, initial encounter: Secondary | ICD-10-CM

## 2021-07-30 DIAGNOSIS — E119 Type 2 diabetes mellitus without complications: Secondary | ICD-10-CM | POA: Insufficient documentation

## 2021-07-30 NOTE — ED Triage Notes (Signed)
Pt received both flu and pneumonia shot in his left arm on Tuesday. Left bicep noted to be swollen with erythema at this time

## 2021-07-31 LAB — CBG MONITORING, ED: Glucose-Capillary: 193 mg/dL — ABNORMAL HIGH (ref 70–99)

## 2021-07-31 MED ORDER — FAMOTIDINE 20 MG PO TABS: 20.0000 mg | ORAL_TABLET | Freq: Every day | ORAL | 0 refills | Status: AC

## 2021-07-31 MED ORDER — PREDNISONE 50 MG PO TABS: ORAL_TABLET | ORAL | 0 refills | Status: AC

## 2021-07-31 MED ORDER — FAMOTIDINE 20 MG PO TABS
20.0000 mg | ORAL_TABLET | Freq: Once | ORAL | Status: AC
  Administered 2021-07-31: 20 mg via ORAL
  Filled 2021-07-31: qty 1

## 2021-07-31 MED ORDER — CEPHALEXIN 500 MG PO CAPS: 500.0000 mg | ORAL_CAPSULE | Freq: Three times a day (TID) | ORAL | 0 refills | Status: AC

## 2021-07-31 MED ORDER — DIPHENHYDRAMINE HCL 25 MG PO CAPS
25.0000 mg | ORAL_CAPSULE | Freq: Once | ORAL | Status: AC
  Administered 2021-07-31: 25 mg via ORAL
  Filled 2021-07-31: qty 1

## 2021-07-31 MED ORDER — PREDNISONE 50 MG PO TABS
60.0000 mg | ORAL_TABLET | Freq: Once | ORAL | Status: AC
  Administered 2021-07-31: 60 mg via ORAL
  Filled 2021-07-31: qty 1

## 2021-07-31 NOTE — ED Provider Notes (Signed)
[facility] EMERGENCY DEPARTMENT Provider Note   CSN: [id number] Arrival date & time: 07/30/21  2343     History Chief Complaint  Patient presents with   Wound Check    Vernon Smith is a 60 y.o. male.  Patient with a history of diabetes not on insulin, thyroid disease, renal disorder here with arm pain, redness and swelling.  States he received with a flu and pneumonia shot to his left arm at his PCPs office 4 days ago.  The next day he noticed some increased pain, itching and swelling.  It has progressed daily since then with spreading redness involving his entire upper arm and pain and itching.  He has not tried anything for it at home.  No difficulty breathing or swallowing.  No chest pain or shortness of breath. No nausea or vomiting.  The history is provided by the patient.  Wound Check Pertinent negatives include no chest pain, no abdominal pain, no headaches and no shortness of breath.      Past Medical History:  Diagnosis Date   Cancer ([place])    Diabetes mellitus without complication ([place])    GERD (gastroesophageal reflux disease)    Renal disorder    Thyroid disease     There are no problems to display for this patient.   Past Surgical History:  Procedure Laterality Date   THYROIDECTOMY         No family history on file.  Social History   Tobacco Use   Smoking status: Never   Smokeless tobacco: Never  Substance Use Topics   Alcohol use: No   Drug use: No    Home Medications Prior to Admission medications   Medication Sig Start Date End Date Taking? Authorizing Provider  Diclofenac Sodium CR 100 MG 24 hr tablet Take 1 tablet (100 mg total) by mouth daily. 08/10/20   Palumbo, April, MD  levothyroxine (SYNTHROID, LEVOTHROID) 125 MCG tablet Take 125 mcg by mouth daily before breakfast.    [provider]  ondansetron (ZOFRAN ODT) 8 MG disintegrating tablet 8mg  ODT q8 hours prn nausea 08/10/20   Palumbo, April, MD  pantoprazole  (PROTONIX) 20 MG tablet Take 2 tablets (40 mg total) by mouth daily. 06/19/14   Linton Flemings, MD  rosuvastatin (CRESTOR) 10 MG tablet Take 10 mg by mouth daily.    [provider]  sucralfate (CARAFATE) 1 G tablet Take 1 tablet (1 g total) by mouth 4 (four) times daily. 06/19/14   Linton Flemings, MD  tamsulosin (FLOMAX) 0.4 MG CAPS capsule Take 1 capsule (0.4 mg total) by mouth daily. 08/10/20   Palumbo, April, MD  traMADol (ULTRAM) 50 MG tablet Take 1 tablet (50 mg total) by mouth every 6 (six) hours as needed. 08/16/20   Horton, Barbette Hair, MD    Allergies    Patient has no known allergies.  Review of Systems   Review of Systems  Constitutional:  Negative for activity change, appetite change and fever.  HENT:  Negative for congestion and sinus pain.   Respiratory:  Negative for cough, chest tightness and shortness of breath.   Cardiovascular:  Negative for chest pain.  Gastrointestinal:  Negative for abdominal pain, nausea and vomiting.  Genitourinary:  Negative for dysuria and hematuria.  Skin:  Positive for rash.  Neurological:  Negative for dizziness, weakness and headaches.   all other systems are negative except as noted in the HPI and PMH.   Physical Exam Updated Vital Signs BP (!) 132/97 (BP Location:  Right Arm)   Pulse 64   Temp 98.1 F (36.7 C) (Oral)   Resp 16   Ht 6\' 4"  (1.93 m)   Wt 108.9 kg   SpO2 96%   BMI 29.21 kg/m   Physical Exam Vitals and nursing note reviewed.  Constitutional:      General: He is not in acute distress.    Appearance: He is well-developed.  HENT:     Head: Normocephalic and atraumatic.     Mouth/Throat:     Pharynx: No oropharyngeal exudate.  Eyes:     Conjunctiva/sclera: Conjunctivae normal.     Pupils: Pupils are equal, round, and reactive to light.  Neck:     Comments: No meningismus. Cardiovascular:     Rate and Rhythm: Normal rate and regular rhythm.     Heart sounds: Normal heart sounds. No murmur heard. Pulmonary:      Effort: Pulmonary effort is normal. No respiratory distress.     Breath sounds: Normal breath sounds.  Abdominal:     Palpations: Abdomen is soft.     Tenderness: There is no abdominal tenderness. There is no guarding or rebound.  Musculoskeletal:        General: Swelling present. No tenderness. Normal range of motion.     Cervical back: Normal range of motion and neck supple.     Comments: There is splotchy warmth, tenderness and erythema involving the left upper arm that does not extend past the elbow.  Full range of motion of shoulder and elbow. Intact radial pulse and cardinal hand movements  Skin:    General: Skin is warm.  Neurological:     Mental Status: He is alert and oriented to person, place, and time.     Cranial Nerves: No cranial nerve deficit.     Motor: No abnormal muscle tone.     Coordination: Coordination normal.     Comments:  5/5 strength throughout. CN 2-12 intact.Equal grip strength.   Psychiatric:        Behavior: Behavior normal.    ED Results / Procedures / Treatments   Labs (all labs ordered are listed, but only abnormal results are displayed) Labs Reviewed  CBG MONITORING, ED    EKG None  Radiology No results found.  Procedures Procedures   Medications Ordered in ED Medications  diphenhydrAMINE (BENADRYL) capsule 25 mg (has no administration in time range)  predniSONE (DELTASONE) tablet 60 mg (has no administration in time range)  famotidine (PEPCID) tablet 20 mg (has no administration in time range)    ED Course  I have reviewed the triage vital signs and the nursing notes.  Pertinent labs & imaging results that were available during my care of the patient were reviewed by me and considered in my medical decision making (see chart for details).    MDM Rules/Calculators/A&P                           Vaccine reaction with swelling, redness itching and pain to left upper arm.  Neurovascularly intact  CBG 193.  Patient treated with  antihistamines and steroids with improvement in the redness and swelling.  Low suspicion for cellulitis at this time.  Discussed watching blood sugars closely while taking prednisone.  We will also give antihistamines and anti-inflammatories.  Discussed ice and elevation and PCP follow-up.  If redness progresses in the next 2 days may initiate antibiotics.  Follow-up with PCP.  Return precautions discussed Final Clinical Impression(s) /  ED Diagnoses Final diagnoses:  Vaccine reaction, initial encounter    Rx / DC Orders ED Discharge Orders     None        Stavroula Rohde, Annie Main, MD 07/31/21 [phone]

## 2021-07-31 NOTE — ED Notes (Signed)
Pt NAD, a/ox4, states he received both flu and pneumonia vaccine Tuesday and has a red swollen bicep and arm now. Pt does in fact have redness and hives to left bicep with pain on palpation. Pt denies SOB or anything other than localized reaction. +pmsc

## 2021-07-31 NOTE — Discharge Instructions (Signed)
Watch your blood sugar closely while taking the steroids.  Keep the arm elevated and use ice.  If the redness and swelling are progressed in the next 2 days you may initiate the antibiotics but do not need to start them now.  Return to the ED with worsening pain, fever, spreading redness, difficulty breathing, difficulty swallowing, other concerns

## 2021-07-31 NOTE — ED Notes (Signed)
Pt NAD, a/ox4. Pt verbalizes understanding of all DC and f/u instructions. All questions answered. Pt walks with steady gait to lobby at DC.  ? ?
